# Patient Record
Sex: Female | Born: 1990 | Race: Black or African American | Hispanic: No | Marital: Single | Smoking: Never smoker
Health system: Southern US, Community
[De-identification: ages and names within clinical notes are randomized; demographics above are authoritative.]

## PROBLEM LIST (undated history)

## (undated) HISTORY — PX: TONSILLECTOMY: SUR1361

---

## 2011-01-02 ENCOUNTER — Emergency Department (HOSPITAL_COMMUNITY): Payer: Self-pay

## 2011-01-02 ENCOUNTER — Emergency Department (HOSPITAL_COMMUNITY)
Admission: EM | Admit: 2011-01-02 | Discharge: 2011-01-02 | Disposition: A | Discharge status: Home / Self Care | Payer: Self-pay | Attending: Emergency Medicine | Admitting: Emergency Medicine

## 2011-01-02 DIAGNOSIS — K2970 Gastritis, unspecified, without bleeding: Secondary | ICD-10-CM | POA: Insufficient documentation

## 2011-01-02 DIAGNOSIS — K2990 Gastroduodenitis, unspecified, without bleeding: Secondary | ICD-10-CM | POA: Insufficient documentation

## 2011-01-02 DIAGNOSIS — R109 Unspecified abdominal pain: Secondary | ICD-10-CM | POA: Insufficient documentation

## 2011-01-02 DIAGNOSIS — R1110 Vomiting, unspecified: Secondary | ICD-10-CM | POA: Insufficient documentation

## 2011-01-02 LAB — COMPREHENSIVE METABOLIC PANEL
ALT: 16 U/L (ref 0–35)
Alkaline Phosphatase: 64 U/L (ref 39–117)
CO2: 24 mEq/L (ref 19–32)
Chloride: 104 mEq/L (ref 96–112)
GFR calc Af Amer: >60 mL/min (ref >60)
GFR calc non Af Amer: >60 mL/min (ref >60)
Glucose, Bld: 120 mg/dL — ABNORMAL HIGH (ref 70–99)
Potassium: 3.6 mEq/L (ref 3.5–5.1)
Sodium: 140 mEq/L (ref 135–145)
Total Bilirubin: 0.3 mg/dL (ref 0.3–1.2)

## 2011-01-02 LAB — DIFFERENTIAL
Basophils Absolute: 0 10*3/uL (ref 0.0–0.1)
Basophils Relative: 0 % (ref 0–1)
Lymphocytes Relative: 6 % — ABNORMAL LOW (ref 12–46)
Neutro Abs: 11.6 10*3/uL — ABNORMAL HIGH (ref 1.7–7.7)
Neutrophils Relative %: 90 % — ABNORMAL HIGH (ref 43–77)

## 2011-01-02 LAB — CBC
Hemoglobin: 12.1 g/dL (ref 12.0–15.0)
RBC: 4.52 MIL/uL (ref 3.87–5.11)

## 2011-01-02 LAB — URINALYSIS, ROUTINE W REFLEX MICROSCOPIC
Bilirubin Urine: NEGATIVE
Glucose, UA: NEGATIVE mg/dL
Leukocytes, UA: NEGATIVE
pH: 8.5 — ABNORMAL HIGH (ref 5.0–8.0)

## 2011-01-02 LAB — URINE MICROSCOPIC-ADD ON

## 2011-01-04 LAB — URINE CULTURE: Culture  Setup Time: 201208032117

## 2011-02-25 ENCOUNTER — Emergency Department (HOSPITAL_COMMUNITY)
Admission: EM | Admit: 2011-02-25 | Discharge: 2011-02-25 | Disposition: A | Discharge status: Home / Self Care | Payer: Self-pay | Attending: Emergency Medicine | Admitting: Emergency Medicine

## 2011-02-25 DIAGNOSIS — R109 Unspecified abdominal pain: Secondary | ICD-10-CM | POA: Insufficient documentation

## 2011-02-25 DIAGNOSIS — R112 Nausea with vomiting, unspecified: Secondary | ICD-10-CM | POA: Insufficient documentation

## 2011-02-25 DIAGNOSIS — R10816 Epigastric abdominal tenderness: Secondary | ICD-10-CM | POA: Insufficient documentation

## 2011-02-25 LAB — COMPREHENSIVE METABOLIC PANEL
ALT: 12 U/L (ref 0–35)
AST: 15 U/L (ref 0–37)
Albumin: 3.8 g/dL (ref 3.5–5.2)
Alkaline Phosphatase: 58 U/L (ref 39–117)
BUN: 7 mg/dL (ref 6–23)
CO2: 27 mEq/L (ref 19–32)
Calcium: 9.1 mg/dL (ref 8.4–10.5)
Chloride: 101 mEq/L (ref 96–112)
Creatinine, Ser: 0.57 mg/dL (ref 0.50–1.10)
GFR calc Af Amer: >60 mL/min (ref >60)
GFR calc non Af Amer: >60 mL/min (ref >60)
Glucose, Bld: 100 mg/dL — ABNORMAL HIGH (ref 70–99)
Potassium: 3.5 mEq/L (ref 3.5–5.1)
Sodium: 137 mEq/L (ref 135–145)
Total Bilirubin: 0.2 mg/dL — ABNORMAL LOW (ref 0.3–1.2)
Total Protein: 7.6 g/dL (ref 6.0–8.3)

## 2011-02-25 LAB — URINALYSIS, ROUTINE W REFLEX MICROSCOPIC
Bilirubin Urine: NEGATIVE
Glucose, UA: NEGATIVE mg/dL
Hgb urine dipstick: NEGATIVE
Ketones, ur: NEGATIVE mg/dL
Nitrite: NEGATIVE
Protein, ur: NEGATIVE mg/dL
Specific Gravity, Urine: 1.008 (ref 1.005–1.030)
Urobilinogen, UA: 0.2 mg/dL (ref 0.0–1.0)
pH: 8 (ref 5.0–8.0)

## 2011-02-25 LAB — URINE MICROSCOPIC-ADD ON

## 2011-02-25 LAB — PREGNANCY, URINE: Preg Test, Ur: NEGATIVE

## 2011-02-25 LAB — LIPASE, BLOOD: Lipase: 41 U/L (ref 11–59)

## 2011-06-25 ENCOUNTER — Emergency Department (HOSPITAL_COMMUNITY)
Admission: EM | Admit: 2011-06-25 | Discharge: 2011-06-25 | Discharge status: Against Medical Advice | Payer: Self-pay | Attending: Emergency Medicine | Admitting: Emergency Medicine

## 2011-06-25 ENCOUNTER — Emergency Department (HOSPITAL_COMMUNITY): Payer: Self-pay

## 2011-06-25 ENCOUNTER — Encounter (HOSPITAL_COMMUNITY): Payer: Self-pay | Admitting: Emergency Medicine

## 2011-06-25 DIAGNOSIS — R05 Cough: Secondary | ICD-10-CM | POA: Insufficient documentation

## 2011-06-25 DIAGNOSIS — R059 Cough, unspecified: Secondary | ICD-10-CM | POA: Insufficient documentation

## 2011-06-25 DIAGNOSIS — J069 Acute upper respiratory infection, unspecified: Secondary | ICD-10-CM | POA: Insufficient documentation

## 2011-06-25 MED ORDER — OXYMETAZOLINE HCL 0.05 % NA SOLN: 1.0000 | Freq: Once | NASAL | Status: DC

## 2011-06-25 MED ORDER — HYDROCOD POLST-CHLORPHEN POLST 10-8 MG/5ML PO LQCR: 5.0000 mL | Freq: Two times a day (BID) | ORAL | Status: AC

## 2011-06-25 MED ORDER — HYDROCOD POLST-CHLORPHEN POLST 10-8 MG/5ML PO LQCR
5.0000 mL | Freq: Once | ORAL | Status: DC
  Filled 2011-06-25: qty 5

## 2011-06-25 NOTE — ED Notes (Signed)
PT. REPORTS PERSISTENT PRODUCTIVE COUGH WITH NASAL CONGESTION AND LEFT EAR DISCOMFORT FOR 1 WEEK , ALSO REPORTS HEADACHE AND BODY ACHES.

## 2011-06-25 NOTE — ED Notes (Signed)
Meds not given pt not in room.

## 2011-06-25 NOTE — ED Provider Notes (Signed)
History     CSN: [id number]  Arrival date & time 06/25/11  2129   First MD Initiated Contact with Patient 06/25/11 2221      Chief Complaint  Patient presents with  . Cough     HPI  History provided by the patient. Patient is a 21 year old female no significant past medical history who presents with complaints of persistent productive cough with nasal congestion past one week. Symptoms began gradually and have been persistent. Symptoms are associated with some headaches, left ear pain and body aches. Patient denies fever, chills, sweats. Patient has taken over-the-counter cough and cold medicines without any improvement of symptoms. Patient denies any nausea vomiting sore throat. Patient denies any abdominal pain, diarrhea, constipation. Patient does report getting around friend last week with similar symptoms. She reports he has improved at this time.    History reviewed. No pertinent past medical history.  Past Surgical History  Procedure Date  . Tonsillectomy     No family history on file.  History  Substance Use Topics  . Smoking status: Never Smoker   . Smokeless tobacco: Not on file  . Alcohol Use: Yes    OB History    Grav Para Term Preterm Abortions TAB SAB Ect Mult Living                  Review of Systems  Constitutional: Negative for fever, chills, diaphoresis and appetite change.  HENT: Positive for ear pain, congestion and rhinorrhea. Negative for sore throat.   Respiratory: Positive for cough. Negative for shortness of breath.   Cardiovascular: Negative for chest pain.  Gastrointestinal: Negative for nausea, vomiting, abdominal pain, diarrhea and constipation.  All other systems reviewed and are negative.    Allergies  Penicillins cross reactors  Home Medications   Current Outpatient Rx  Name Route Sig Dispense Refill  . GUAIFENESIN ER 600 MG PO TB12 Oral Take 1,200 mg by mouth 2 (two) times daily as needed. For cough and cold    . NYQUIL D  COLD/FLU PO Oral Take 2 capsules by mouth at bedtime as needed. For cold and flu    . DAYQUIL PO Oral Take 2 capsules by mouth 2 (two) times daily as needed. For cold      BP 137/89  Pulse 91  Temp(Src) 97.5 F (36.4 C) (Oral)  Resp 18  SpO2 97%  LMP 06/18/2011  Physical Exam  Nursing note and vitals reviewed. Constitutional: She is oriented to person, place, and time. She appears well-developed and well-nourished. No distress.  HENT:  Head: Normocephalic.  Right Ear: External ear normal.  Mouth/Throat: Oropharynx is clear and moist.       Left TM erythematous with bulging. Nostrils edematous with drainage.  Eyes: Conjunctivae and EOM are normal. Pupils are equal, round, and reactive to light.  Neck: Normal range of motion. Neck supple.  Cardiovascular: Normal rate and regular rhythm.   Pulmonary/Chest: Effort normal and breath sounds normal. No respiratory distress. She has no wheezes. She has no rales. She exhibits no tenderness.  Abdominal: Soft. There is no tenderness. There is no rebound.  Lymphadenopathy:    She has no cervical adenopathy.  Neurological: She is alert and oriented to person, place, and time.  Skin: Skin is warm and dry. No rash noted.  Psychiatric: She has a normal mood and affect. Her behavior is normal.    ED Course  Procedures   Labs Reviewed - No data to display Dg Chest 2 View  06/25/2011  *RADIOLOGY REPORT*  Clinical Data: Chest congestion.  CHEST - 2 VIEW  Comparison: None.  Findings: Lungs are clear.  Heart size is normal.  No pneumothorax or pleural effusion.  No focal bony abnormality.  IMPRESSION: Negative chest.  Original Report Authenticated By: Bernadene Bell. D'ALESSIO, M.D.     1. Upper respiratory infection       MDM  10:20 PM patient seen and evaluated. Patient in no acute distress. Patient with normal respirations and good O2 saturation. Patient is PERC negative.        Angus Seller, [place] 06/25/11 2337

## 2011-06-27 NOTE — ED Provider Notes (Signed)
Medical screening examination/treatment/procedure(s) were performed by non-physician practitioner and as supervising physician I was immediately available for consultation/collaboration.  Gerhard Munch, MD 06/27/11 [phone]

## 2012-05-18 IMAGING — US US ABDOMEN COMPLETE
1 series · 14 of 25 positions shown · non-contrast
Comparison: None

CLINICAL DATA: Abdominal pain

ULTRASOUND ABDOMEN:
TECHNIQUE: Sonography of upper abdominal structures was performed.

[Series 1: us abdomen complete · 0.22mm/px · 14 of 35 slices shown]
[im 1/35]
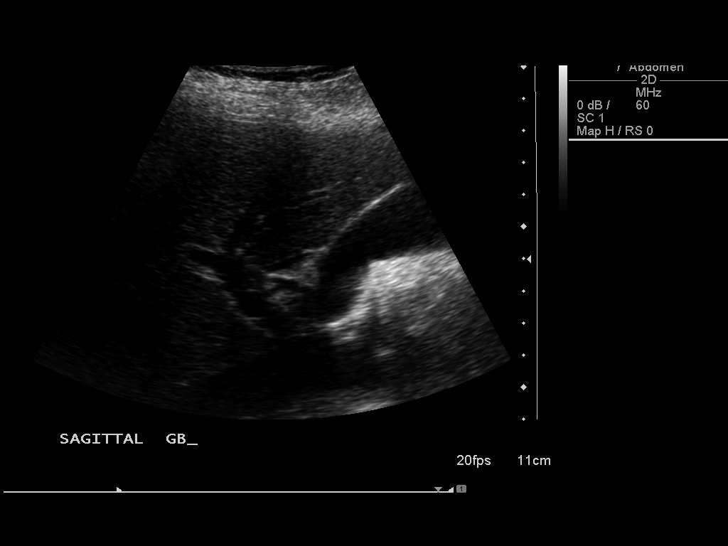
[im 3/35]
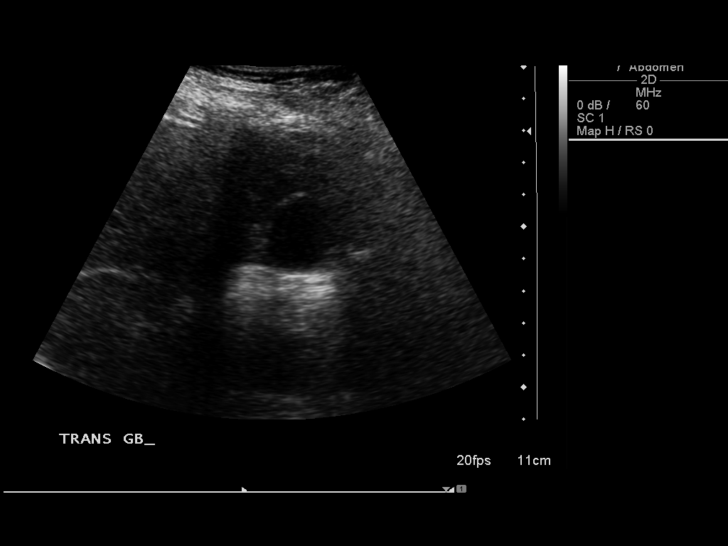
[im 6/35]
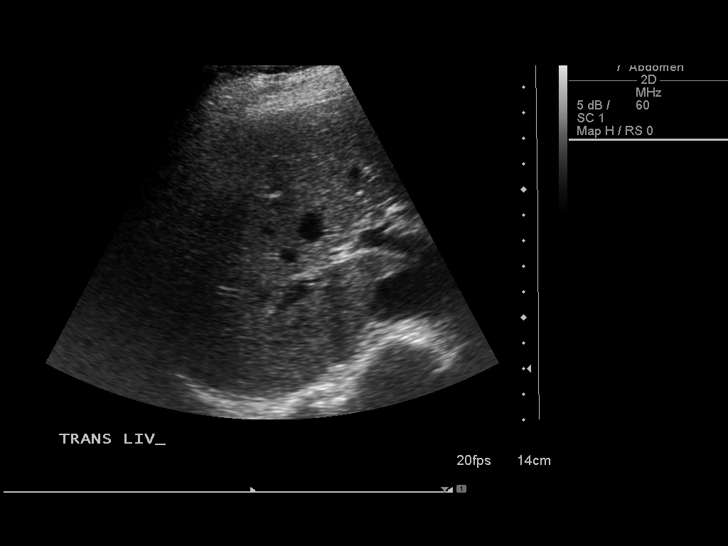
[im 9/35]
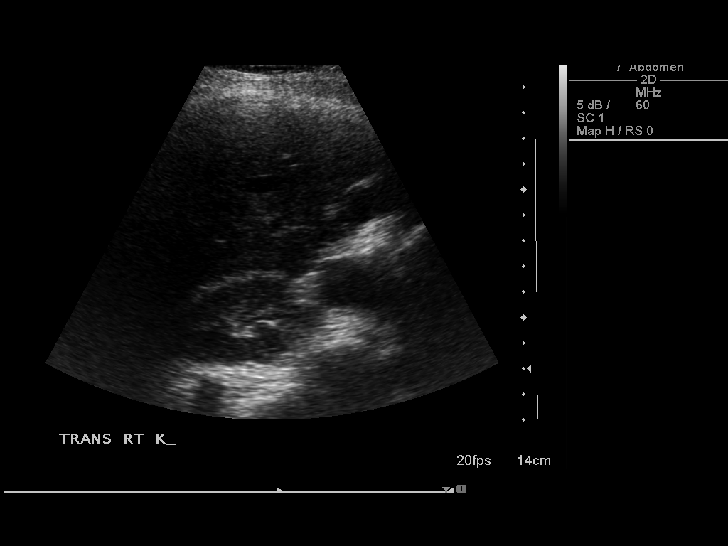
[im 12/35]
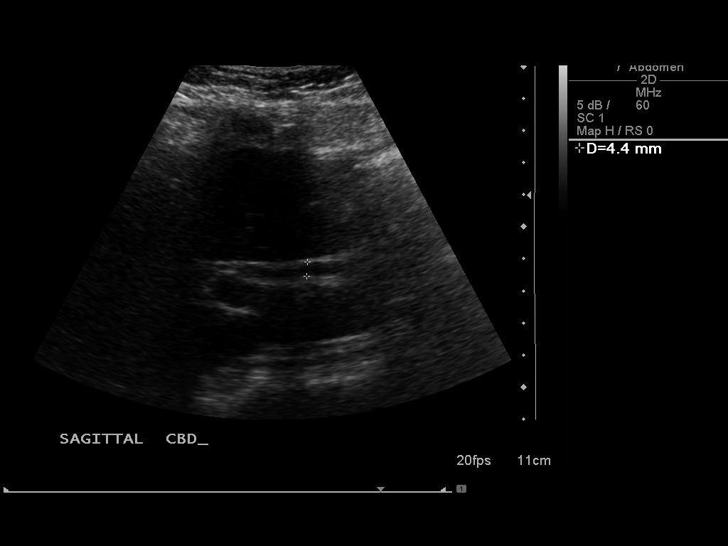
[im 13/35]
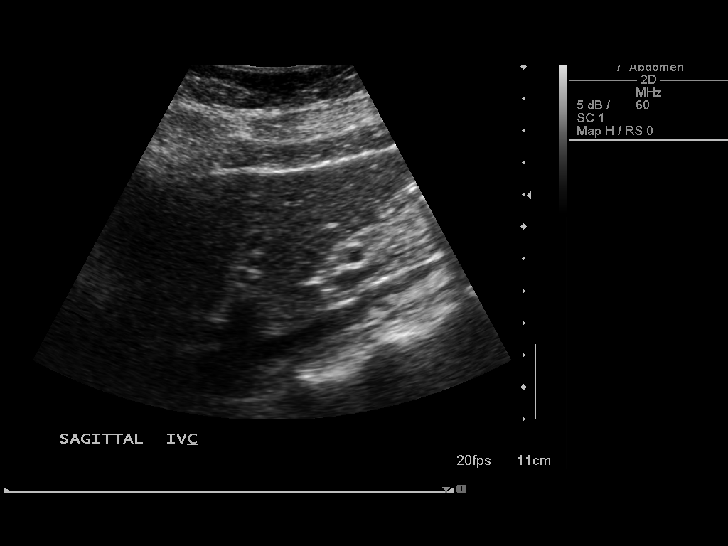
[im 16/35]
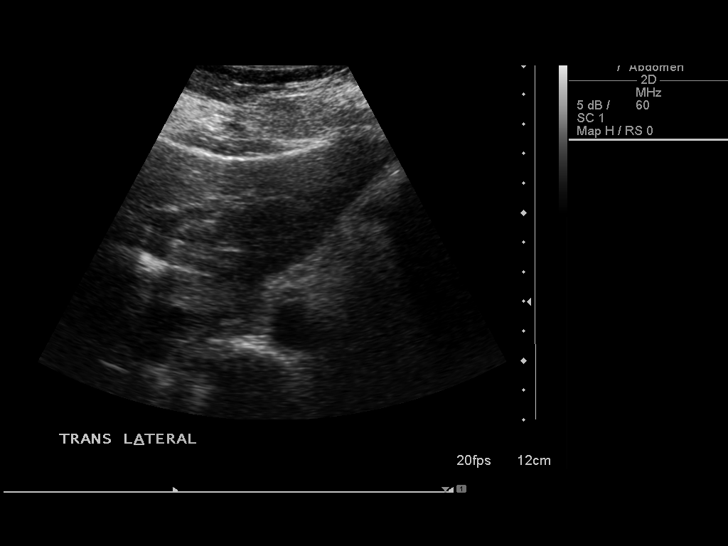
[im 19/35]
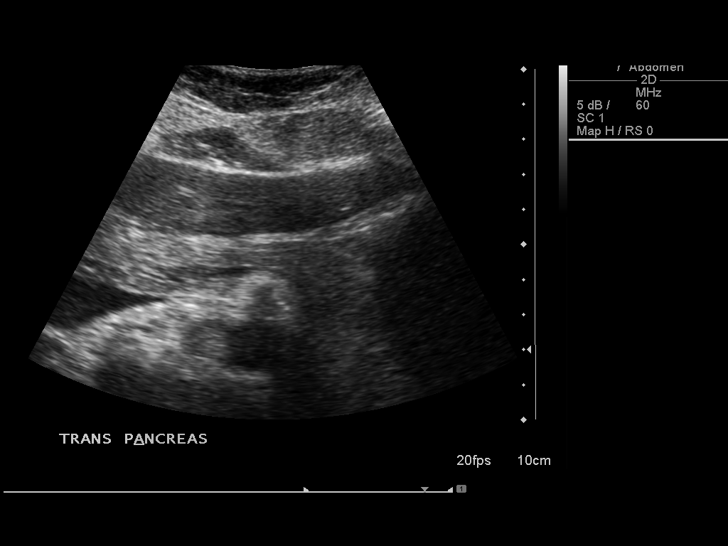
[im 22/35]
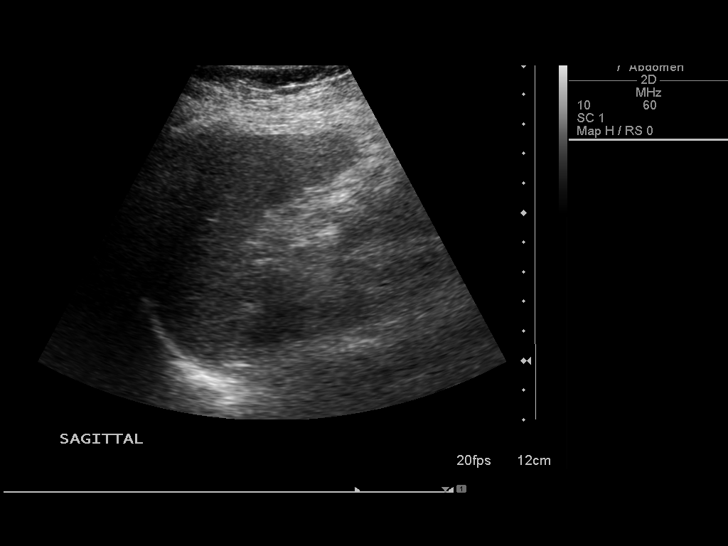
[im 23/35]
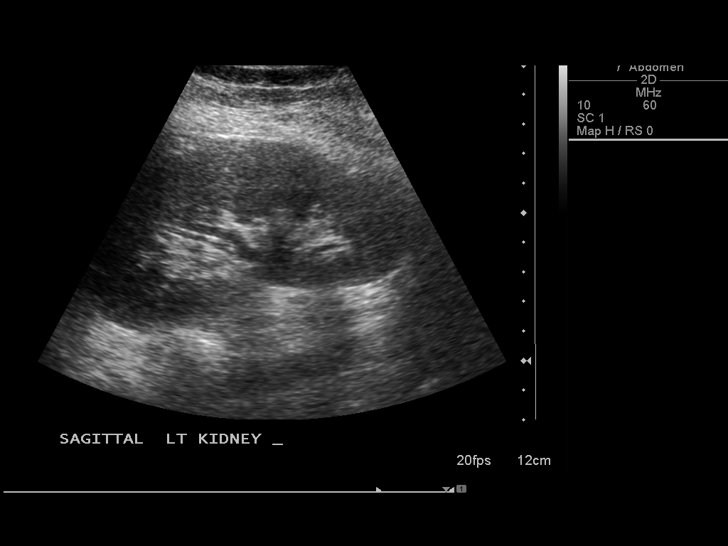
[im 26/35]
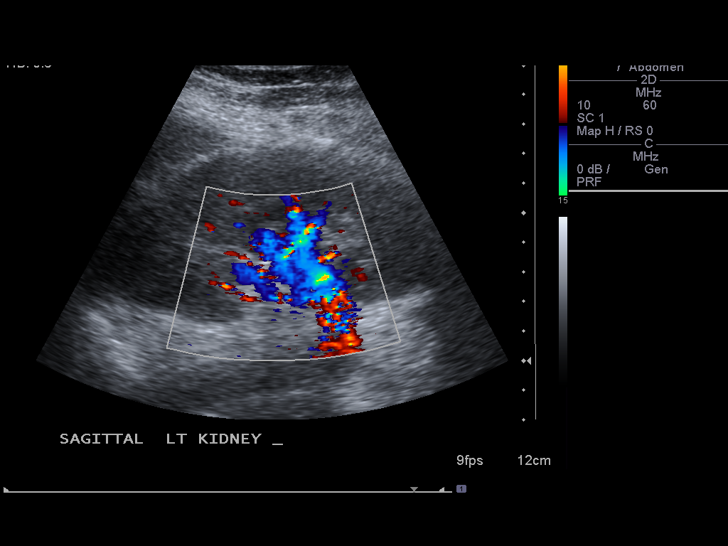
[im 29/35]
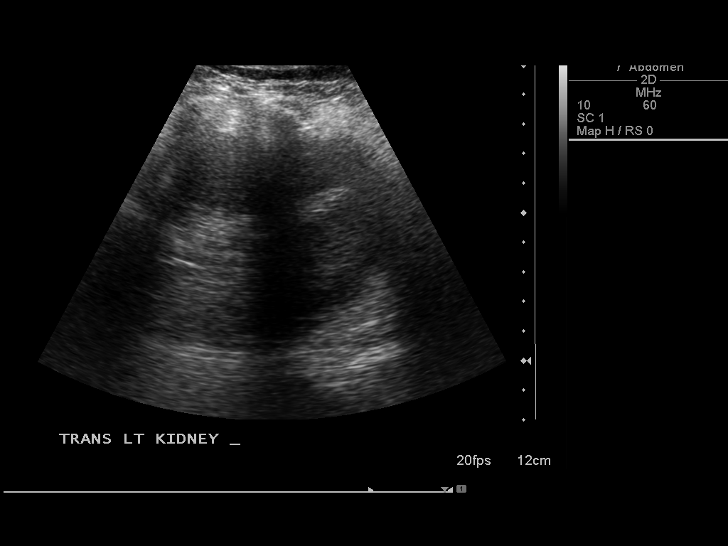
[im 32/35]
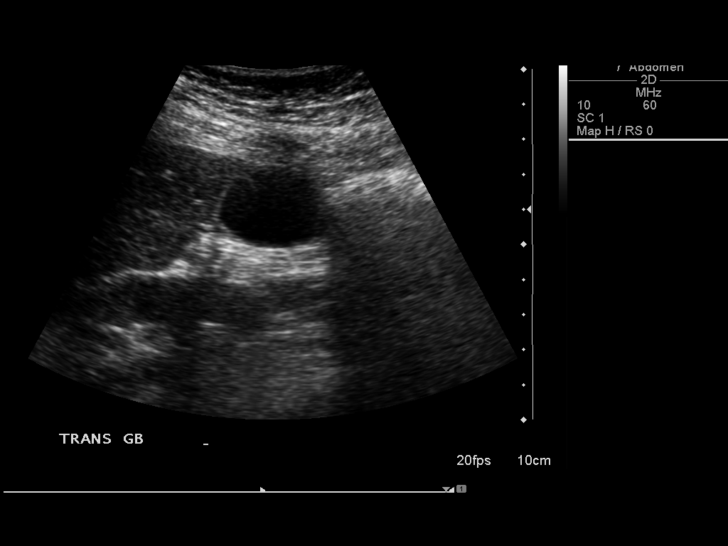
[im 35/35]
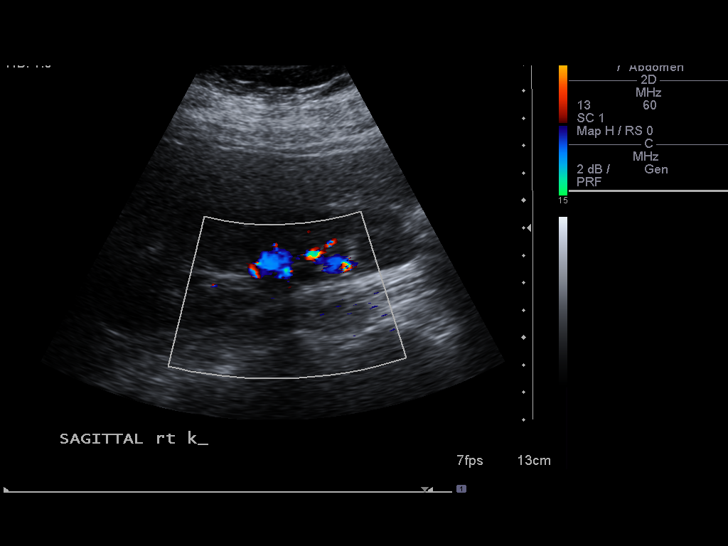

[14 of 25 positions shown; findings below may reference images not displayed]

Gallbladder:  Normally distended without stones or wall thickening.
No pericholecystic fluid or sonographic Murphy sign.

Common bile duct:  Normal caliber 4 mm diameter

Liver:  Normal appearance

IVC:  Normal appearance

Pancreas:  Normal appearance

Spleen:  Normal appearance, 6.8 cm length.

Right kidney:  10.6 cm length. Normal morphology without mass or
hydronephrosis.

Left kidney:  11.1 cm length. Normal morphology without mass or
hydronephrosis.

Aorta:  Normal caliber

Other:  No free fluid
IMPRESSION: Normal exam.

## 2012-11-08 IMAGING — CR DG CHEST 2V
2 series · 2 of 2 positions shown · non-contrast
Comparison: None.

CLINICAL DATA: Chest congestion.

CHEST - 2 VIEW

[w chest pa]
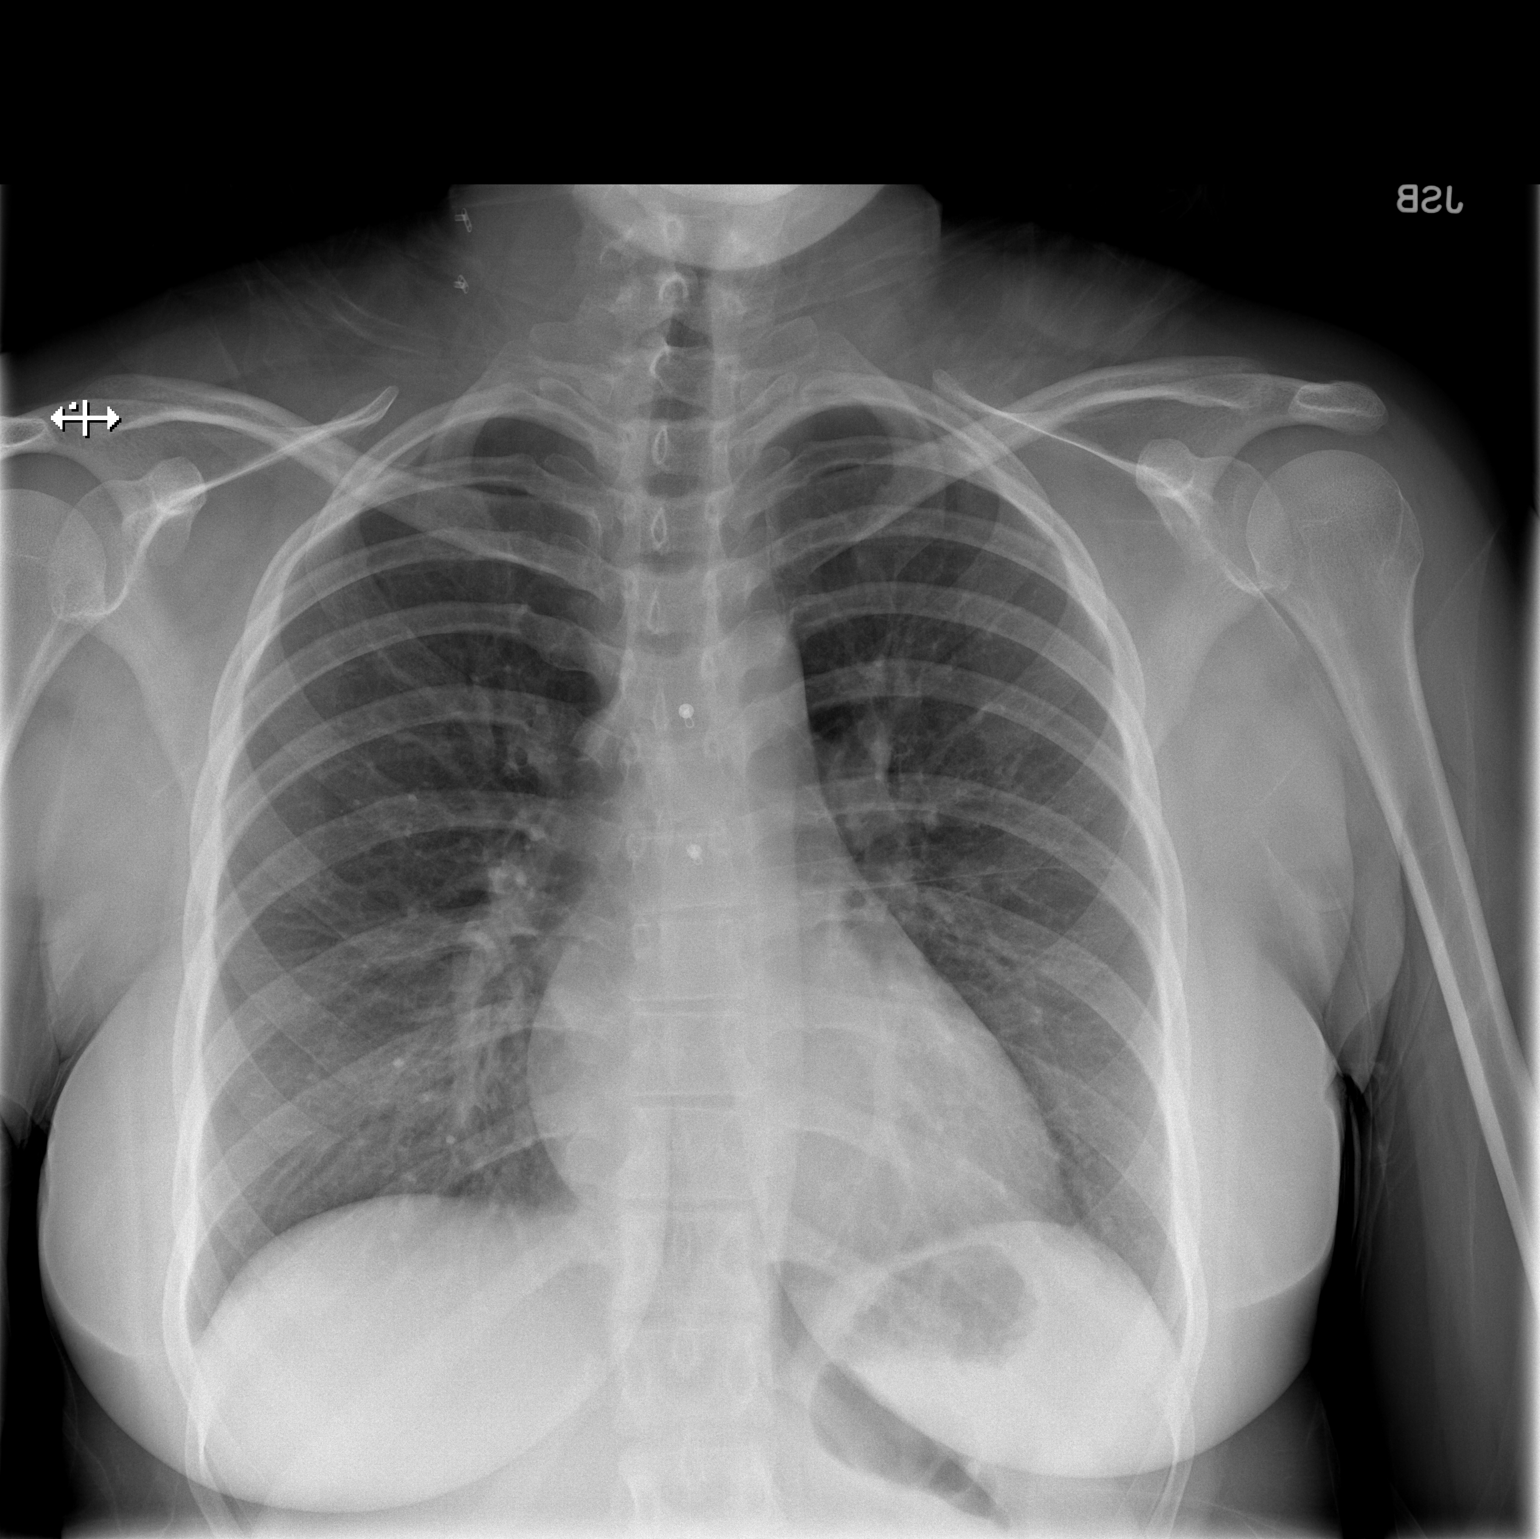

[w chest lat]
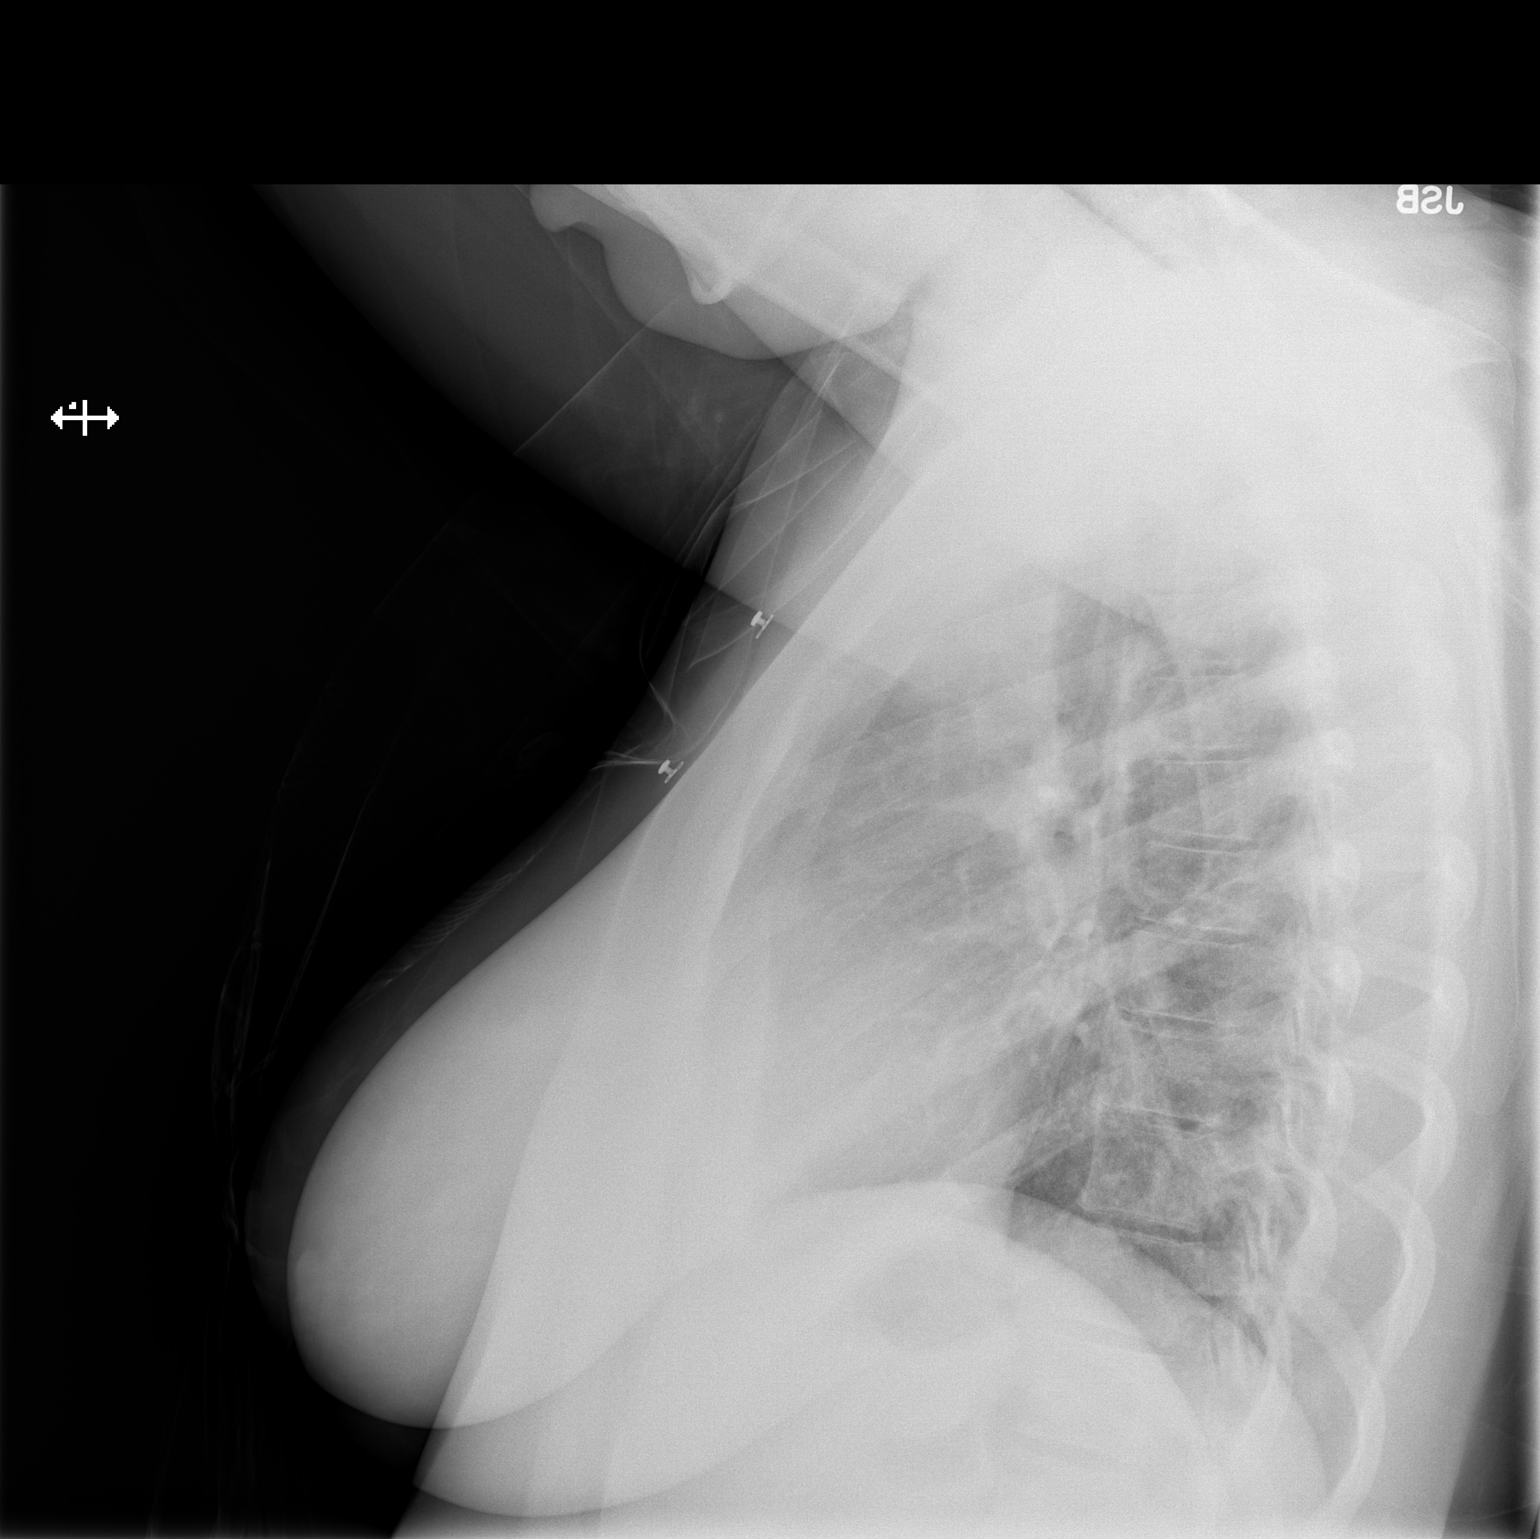

[2 of 2 positions shown; findings below may reference images not displayed]

FINDINGS: Lungs are clear.  Heart size is normal.  No pneumothorax
or pleural effusion.  No focal bony abnormality.
IMPRESSION: Negative chest.

## 2016-11-08 ENCOUNTER — Emergency Department (HOSPITAL_COMMUNITY)
Admission: EM | Admit: 2016-11-08 | Discharge: 2016-11-08 | Disposition: A | Discharge status: Home / Self Care | Payer: Self-pay | Attending: Emergency Medicine | Admitting: Emergency Medicine

## 2016-11-08 ENCOUNTER — Encounter (HOSPITAL_COMMUNITY): Payer: Self-pay

## 2016-11-08 DIAGNOSIS — Y9389 Activity, other specified: Secondary | ICD-10-CM | POA: Insufficient documentation

## 2016-11-08 DIAGNOSIS — W458XXA Other foreign body or object entering through skin, initial encounter: Secondary | ICD-10-CM | POA: Insufficient documentation

## 2016-11-08 DIAGNOSIS — Z7721 Contact with and (suspected) exposure to potentially hazardous body fluids: Secondary | ICD-10-CM | POA: Insufficient documentation

## 2016-11-08 DIAGNOSIS — Y990 Civilian activity done for income or pay: Secondary | ICD-10-CM | POA: Insufficient documentation

## 2016-11-08 DIAGNOSIS — Z79899 Other long term (current) drug therapy: Secondary | ICD-10-CM | POA: Insufficient documentation

## 2016-11-08 DIAGNOSIS — Y9289 Other specified places as the place of occurrence of the external cause: Secondary | ICD-10-CM | POA: Insufficient documentation

## 2016-11-08 DIAGNOSIS — S61032A Puncture wound without foreign body of left thumb without damage to nail, initial encounter: Secondary | ICD-10-CM | POA: Insufficient documentation

## 2016-11-08 DIAGNOSIS — Z23 Encounter for immunization: Secondary | ICD-10-CM | POA: Insufficient documentation

## 2016-11-08 LAB — BASIC METABOLIC PANEL
Anion gap: 8 (ref 5–15)
BUN: 10 mg/dL (ref 6–20)
CHLORIDE: 103 mmol/L (ref 101–111)
CO2: 26 mmol/L (ref 22–32)
Calcium: 9.3 mg/dL (ref 8.9–10.3)
Creatinine, Ser: 0.73 mg/dL (ref 0.44–1.00)
Glucose, Bld: 100 mg/dL — ABNORMAL HIGH (ref 65–99)
POTASSIUM: 3.6 mmol/L (ref 3.5–5.1)
SODIUM: 137 mmol/L (ref 135–145)

## 2016-11-08 MED ORDER — EMTRICITABINE-TENOFOVIR AF 200-25 MG PO TABS: 1.0000 | ORAL_TABLET | Freq: Every day | ORAL | 0 refills | Status: DC

## 2016-11-08 MED ORDER — EMTRICITABINE-TENOFOVIR AF 200-25 MG PO TABS
1.0000 | ORAL_TABLET | Freq: Once | ORAL | Status: AC
  Administered 2016-11-08: 1 via ORAL
  Filled 2016-11-08: qty 1

## 2016-11-08 MED ORDER — DOLUTEGRAVIR SODIUM 50 MG PO TABS
50.0000 mg | ORAL_TABLET | Freq: Once | ORAL | Status: AC
  Administered 2016-11-08: 50 mg via ORAL
  Filled 2016-11-08: qty 1

## 2016-11-08 MED ORDER — DOLUTEGRAVIR SODIUM 50 MG PO TABS: 50.0000 mg | ORAL_TABLET | Freq: Every day | ORAL | 0 refills | Status: DC

## 2016-11-08 MED ORDER — ONDANSETRON 4 MG PO TBDP: 4.0000 mg | ORAL_TABLET | Freq: Three times a day (TID) | ORAL | 0 refills | Status: AC | PRN

## 2016-11-08 MED ORDER — TETANUS-DIPHTH-ACELL PERTUSSIS 5-2.5-18.5 LF-MCG/0.5 IM SUSP
0.5000 mL | Freq: Once | INTRAMUSCULAR | Status: AC
  Administered 2016-11-08: 0.5 mL via INTRAMUSCULAR
  Filled 2016-11-08: qty 0.5

## 2016-11-08 NOTE — ED Triage Notes (Signed)
Pt complaining of needle stick to L thumb. 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 Bleeding controlled at triage. NAD.

## 2016-11-08 NOTE — ED Notes (Signed)
Pt verbalized understanding discharge instructions and denies any further needs or questions at this time. VS stable, ambulatory and steady gait.   

## 2016-11-08 NOTE — ED Notes (Addendum)
Waiting for medication from pharmacy.

## 2016-11-08 NOTE — ED Provider Notes (Signed)
[facility]EM[facility]PT Provider Note   CSN: [id number] Arrival date & time: 11/08/16  2050  By signing my name below, I, Cynda Acres, attest that this documentation has been prepared under the direction and in the presence of Tacy Chavis, PA-C. Electronically Signed: Cynda Acres, Scribe. 11/08/16. 9:59 PM.  History   Chief Complaint Chief Complaint  Patient presents with  . Body Fluid Exposure   HPI Comments: Alexandria Medina is a 26 y.o. female with no pertinent past medical history, who presents to the Emergency Department complaining of a sudden-onset body fluid exposure to the left thumb, which occurred earlier tonight at 6 pm. 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 Patient was stuck with a hollow core 14-gauge piercing needle in the thumb after it had passed through the clients body. Unknown information about the client, including unknown HIV status. Patient reports an associated small needle puncture wound to the left thumb. No modifying factors indicated. Last tetanus shot was unknown. Patient denies being HIV positive. Denies current pain or other complaints.  The history is provided by the patient. No language interpreter was used.    History reviewed. No pertinent past medical history.  There are no active problems to display for this patient.   Past Surgical History:  Procedure Laterality Date  . TONSILLECTOMY      OB History    No data available       Home Medications    Prior to Admission medications   Medication Sig Start Date End Date Taking? Authorizing Provider  chlorpheniramine-HYDROcodone (TUSSIONEX PENNKINETIC ER) 10-8 MG/5ML LQCR Take 5 mLs by mouth every 12 (twelve) hours. Take 5 mLs by mouth every 12 (twelve) hours. 06/25/11   Ivonne Andrew, PA-C  dolutegravir (TIVICAY) 50 MG tablet Take 1 tablet (50 mg total) by mouth daily. 11/08/16   Shon Indelicato C, PA-C  emtricitabine-tenofovir AF (DESCOVY) 200-25 MG tablet Take 1 tablet by mouth daily. 11/08/16   Shandy Vi C,  PA-C  guaiFENesin (MUCINEX) 600 MG 12 hr tablet Take 1,200 mg by mouth 2 (two) times daily as needed. For cough and cold    [provider]  ondansetron (ZOFRAN ODT) 4 MG disintegrating tablet Take 1 tablet (4 mg total) by mouth every 8 (eight) hours as needed for nausea or vomiting. 11/08/16   Anali Cabanilla C, PA-C  Pseudoeph-Doxylamine-DM-APAP (NYQUIL D COLD/FLU PO) Take 2 capsules by mouth at bedtime as needed. For cold and flu    [provider]  Pseudoephedrine-APAP-DM (DAYQUIL PO) Take 2 capsules by mouth 2 (two) times daily as needed. For cold    [provider]    Family History History reviewed. No pertinent family history.  Social History Social History  Substance Use Topics  . Smoking status: Never Smoker  . Smokeless tobacco: Never Used  . Alcohol use Yes     Allergies   Penicillins cross reactors   Review of Systems Review of Systems  Skin: Positive for wound (small needle puncture wound to the left thumb).  Neurological: Negative for numbness.     Physical Exam Updated Vital Signs BP 140/83 (BP Location: Right Arm)   Pulse 74   Temp 97.5 F (36.4 C) (Oral)   Resp 18   Ht 5\' 7"  (1.702 m)   Wt 90 kg (198 lb 6.6 oz)   LMP 10/21/2016 (Approximate)   SpO2 100%   BMI 31.08 kg/m   Physical Exam  Constitutional: She appears well-developed and well-nourished. No distress.  HENT:  Head: Normocephalic and atraumatic.  Eyes: Conjunctivae are normal.  Neck: Neck supple.  Cardiovascular: Normal rate, regular rhythm and intact distal pulses.   Pulmonary/Chest: Effort normal.  Neurological: She is alert.  Skin: Skin is warm and dry. Capillary refill takes less than 2 seconds. She is not diaphoretic.  She has a small needle wound to the palmer side of the left distal thumb. No active hemorrhage. No associated swelling.  Psychiatric: She has a normal mood and affect. Her behavior is normal.  Nursing note and vitals reviewed.    ED  Treatments / Results  DIAGNOSTIC STUDIES: Oxygen Saturation is 100% on RA, normal by my interpretation.    COORDINATION OF CARE: 9:58 PM Discussed treatment plan with pt at bedside and pt agreed to plan, which includes a HIV workup.   Labs (all labs ordered are listed, but only abnormal results are displayed) Labs Reviewed  BASIC METABOLIC PANEL - Abnormal; Notable for the following:       Result Value   Glucose, Bld 100 (*)    All other components within normal limits  HIV ANTIBODY (ROUTINE TESTING)  HEPATITIS C ANTIBODY  HEPATITIS B SURFACE ANTIGEN  RPR    EKG  EKG Interpretation None       Radiology No results found.  Procedures Procedures (including critical care time)  Medications Ordered in ED Medications  dolutegravir (TIVICAY) tablet 50 mg (50 mg Oral Given 11/08/16 2252)  emtricitabine-tenofovir AF (DESCOVY) 200-25 MG per tablet 1 tablet (1 tablet Oral Given 11/08/16 2253)  Tdap (BOOSTRIX) injection 0.5 mL (0.5 mLs Intramuscular Given 11/08/16 2253)     Initial Impression / Assessment and Plan / ED Course  I have reviewed the triage vital signs and the nursing notes.  Pertinent labs & imaging results that were available during my care of the patient were reviewed by me and considered in my medical decision making (see chart for details).      Patient presents with a needle stick exposure. Source unavailable. PEP initiated. PCP follow-up.  Findings and plan of care discussed with Rolland PorterMark James, MD.   Final Clinical Impressions(s) / ED Diagnoses   Final diagnoses:  Exposure to blood    New Prescriptions Discharge Medication List as of 11/08/2016 10:52 PM    START taking these medications   Details  dolutegravir (TIVICAY) 50 MG tablet Take 1 tablet (50 mg total) by mouth daily., Starting Sun 11/08/2016, Print    emtricitabine-tenofovir AF (DESCOVY) 200-25 MG tablet Take 1 tablet by mouth daily., Starting Sun 11/08/2016, Print    ondansetron (ZOFRAN ODT)  4 MG disintegrating tablet Take 1 tablet (4 mg total) by mouth every 8 (eight) hours as needed for nausea or vomiting., Starting Sun 11/08/2016, Print       I personally performed the services described in this documentation, which was scribed in my presence. The recorded information has been reviewed and is accurate.   Concepcion LivingJoy, Myrka Sylva C, PA-C 11/10/16 2122    Rolland PorterJames, Mark, MD 11/16/16 [phone]

## 2016-11-08 NOTE — ED Notes (Signed)
Patient called regarding PEP tx for needle stick, per Dr. Fayrene FearingJames we would be able to provide either meds or script and preliminary labs.

## 2016-11-08 NOTE — Discharge Instructions (Signed)
You have been seen today for a reported exposure to blood. The source person was unavailable. You have been tested for a baseline value of HIV antibody, Hepatitis B, hepatitis C, and syphilis. If the source person is positive for any of these, that will not turn these tests positive today. They are used only as a baseline to determine your status. Please initiate the antiviral therapy and take it for the next 28 days. You are being prescribed dolutegravir (Tivicay) 50 mg daily and emtricitabine-tenofovir (Descovy) 200-25mg  daily.  If for some reason the above medications are too expensive, you may have your PCP instead prescribe elvitegravir-cobicistat-emtricitabine-tenofovir (Genvoya) 150-150-200-10mg  daily.  You may have your PCP perform repeat HIV antibody testing, as well as the other tests above, at 3 months, 6 months, and 1 year.  Zofran has been included in case the medications give you nausea.

## 2016-11-09 ENCOUNTER — Encounter: Payer: Self-pay | Admitting: *Deleted

## 2016-11-09 LAB — RPR: RPR Ser Ql: NONREACTIVE

## 2016-11-09 LAB — HIV ANTIBODY (ROUTINE TESTING W REFLEX): HIV Screen 4th Generation wRfx: NONREACTIVE

## 2016-11-09 NOTE — Progress Notes (Signed)
EDCM notes nPep orders for pt.  Pt has medical insurance that will help cover the costs of medication.  EDCM will call pt to insure she will follow up for labs.

## 2016-11-10 ENCOUNTER — Emergency Department (HOSPITAL_COMMUNITY)
Admission: EM | Admit: 2016-11-10 | Discharge: 2016-11-11 | Disposition: A | Discharge status: Home / Self Care | Payer: PRIVATE HEALTH INSURANCE | Attending: Emergency Medicine | Admitting: Emergency Medicine

## 2016-11-10 ENCOUNTER — Encounter (HOSPITAL_COMMUNITY): Payer: Self-pay

## 2016-11-10 DIAGNOSIS — Z7721 Contact with and (suspected) exposure to potentially hazardous body fluids: Secondary | ICD-10-CM | POA: Insufficient documentation

## 2016-11-10 LAB — HEPATITIS C ANTIBODY: HCV Ab: <0.1 s/co ratio (ref 0.0–0.9)

## 2016-11-10 LAB — HEPATITIS B SURFACE ANTIGEN: Hepatitis B Surface Ag: NEGATIVE

## 2016-11-10 MED ORDER — ELVITEG-COBIC-EMTRICIT-TENOFAF 150-150-200-10 MG PO TABS
1.0000 | ORAL_TABLET | ORAL | Status: AC
  Administered 2016-11-10: 1 via ORAL
  Filled 2016-11-10: qty 1

## 2016-11-10 MED ORDER — ELVITEG-COBIC-EMTRICIT-TENOFAF 150-150-200-10 MG PO TABS: 1.0000 | ORAL_TABLET | Freq: Every day | ORAL | 0 refills | Status: AC

## 2016-11-10 MED ORDER — DOXYCYCLINE HYCLATE 100 MG PO CAPS: 100.0000 mg | ORAL_CAPSULE | Freq: Two times a day (BID) | ORAL | 0 refills | Status: AC

## 2016-11-10 NOTE — ED Notes (Signed)
See note from Child psychotherapistocial Worker.

## 2016-11-10 NOTE — ED Notes (Signed)
Social worker getting Rx for patient from pharmacy at this time.  Per protocol, pt must have 5 days' worth of this medication before leaving the ED.

## 2016-11-10 NOTE — Care Management (Signed)
ED CM [facility] concerning not being able to afford nPEP medication. Patient was seen here in the ED and given a prescription for 28 days but was not given 5 days of medications from the ED.  Patient has not  received a voucher number from [employer] to assist with cost of meds.. Patient did  receive1 dose on Sunday and is approaching more than 48 hour window.  Patient states, CM is unable to send information to [place] office is closed and will not reopen until in the morning.  CM [facility].

## 2016-11-10 NOTE — ED Provider Notes (Signed)
[facility]PT Provider Note   CSN: [id number] Arrival date & time: 11/10/16  2116     History   Chief Complaint Chief Complaint  Patient presents with  . need prescription for generic    HPI Alexandria Medina is a 26 y.o. female.  HPI   Alexandria Medina is a 26 y.o. female, patient with no pertinent past medical history, presenting to the ED On a follow-up visit. Patient was seen for post needle exposure on June 10. She was started on PEP. She was prescribed the first choice medications: dolutegravir (Tivicay) 50 mg daily and emtricitabine-tenofovir (Descovy) 200-25mg  daily. She states these are too expensive. She comes requesting the less expensive alternative. She endorses minor throbbing pain to the thumb injured by the needle stick. Denies swelling, neuro changes, fever/chills, other complaints.      History reviewed. No pertinent past medical history.  There are no active problems to display for this patient.   Past Surgical History:  Procedure Laterality Date  . TONSILLECTOMY      OB History    No data available       Home Medications    Prior to Admission medications   Medication Sig Start Date End Date Taking? Authorizing Provider  chlorpheniramine-HYDROcodone (TUSSIONEX PENNKINETIC ER) 10-8 MG/5ML LQCR Take 5 mLs by mouth every 12 (twelve) hours. Take 5 mLs by mouth every 12 (twelve) hours. 06/25/11   Ivonne Andrew, PA-C  doxycycline (VIBRAMYCIN) 100 MG capsule Take 1 capsule (100 mg total) by mouth 2 (two) times daily. 11/10/16 11/15/16  Joy, Hillard Danker, PA-C  elvitegravir-cobicistat-emtricitabine-tenofovir (GENVOYA) 150-150-200-10 MG TABS tablet Take 1 tablet by mouth daily with breakfast. 11/10/16   Joy, Shawn C, PA-C  elvitegravir-cobicistat-emtricitabine-tenofovir (GENVOYA) 150-150-200-10 MG TABS tablet Take 1 tablet by mouth daily with breakfast. 11/10/16 11/15/16  Joy, Shawn C, PA-C  guaiFENesin (MUCINEX) 600 MG 12 hr tablet Take 1,200 mg by mouth 2 (two) times  daily as needed. For cough and cold    [provider]  ondansetron (ZOFRAN ODT) 4 MG disintegrating tablet Take 1 tablet (4 mg total) by mouth every 8 (eight) hours as needed for nausea or vomiting. 11/08/16   Joy, Shawn C, PA-C  Pseudoeph-Doxylamine-DM-APAP (NYQUIL D COLD/FLU PO) Take 2 capsules by mouth at bedtime as needed. For cold and flu    [provider]  Pseudoephedrine-APAP-DM (DAYQUIL PO) Take 2 capsules by mouth 2 (two) times daily as needed. For cold    [provider]    Family History History reviewed. No pertinent family history.  Social History Social History  Substance Use Topics  . Smoking status: Never Smoker  . Smokeless tobacco: Never Used  . Alcohol use Yes     Allergies   Penicillins cross reactors   Review of Systems Review of Systems  Skin: Positive for wound.       Needs different PEP prescriptions.  Neurological: Negative for weakness and numbness.     Physical Exam Updated Vital Signs BP (!) 146/93 (BP Location: Right Arm)   Pulse 73   Temp 98.7 F (37.1 C) (Oral)   Resp 17   Ht 5\' 7"  (1.702 m)   Wt 90 kg (198 lb 6.6 oz)   LMP 10/21/2016 (Approximate)   SpO2 100%   BMI 31.08 kg/m   Physical Exam  Constitutional: She appears well-developed and well-nourished. No distress.  HENT:  Head: Normocephalic and atraumatic.  Eyes: Conjunctivae are normal.  Neck: Neck supple.  Cardiovascular: Normal rate and regular rhythm.  Pulmonary/Chest: Effort normal.  Musculoskeletal: She exhibits tenderness. She exhibits no edema.  Neurological: She is alert.  Skin: Skin is warm and dry. She is not diaphoretic.  Small puncture wound to distal left thumb with minor tenderness. No swelling or exudate. No signs of active infection.  Psychiatric: She has a normal mood and affect. Her behavior is normal.  Nursing note and vitals reviewed.    ED Treatments / Results  Labs (all labs ordered are listed, but only abnormal  results are displayed) Labs Reviewed - No data to display  EKG  EKG Interpretation None       Radiology No results found.  Procedures Procedures (including critical care time)  Medications Ordered in ED Medications  elvitegravir-cobicistat-emtricitabine-tenofovir (GENVOYA) 150-150-200-10 MG tablet 1 tablet (not administered)     Initial Impression / Assessment and Plan / ED Course  I have reviewed the triage vital signs and the nursing notes.  Pertinent labs & imaging results that were available during my care of the patient were reviewed by me and considered in my medical decision making (see chart for details).     Patient returns after previously being seen post body fluid exposure requesting less expensive prophylactic antiretroviral therapy. Patient complains of some thumb pain. Likely due to soreness from the injury, but started on doxycycline as a precaution. Care manager is providing assistance for patient to receive Genovya at low/no cost. Sent home with a 5 day supply of Genovya as well as a prescription for the remainder course. PCP follow-up for future management.  Final Clinical Impressions(s) / ED Diagnoses   Final diagnoses:  Exposure to blood    New Prescriptions New Prescriptions   DOXYCYCLINE (VIBRAMYCIN) 100 MG CAPSULE    Take 1 capsule (100 mg total) by mouth 2 (two) times daily.   ELVITEGRAVIR-COBICISTAT-EMTRICITABINE-TENOFOVIR (GENVOYA) 150-150-200-10 MG TABS TABLET    Take 1 tablet by mouth daily with breakfast.   ELVITEGRAVIR-COBICISTAT-EMTRICITABINE-TENOFOVIR (GENVOYA) 150-150-200-10 MG TABS TABLET    Take 1 tablet by mouth daily with breakfast.     Anselm PancoastJoy, Shawn C, PA-C 11/10/16 2258    Tegeler, Canary Brimhristopher J, MD 11/11/16 [phone]

## 2016-11-10 NOTE — Discharge Instructions (Signed)
[facility]. You are being prescribed 23 days worth of medication. You are also being sent home with 5 days worth of this medication. You are also being started on an antibiotic to prevent infection in the thumb due to some increased pain to the area. This is likely not a sign of an infection, but antibiotics are a precaution. Follow up with her primary care provider for continued management.

## 2016-11-10 NOTE — ED Triage Notes (Signed)
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  Medications prescribed are too expensive (1700.00 dollars each).  Pt 72 hours will end tomorrow.  Pt needs prescription written for generic of the two medication prescriptions.
